# Patient Record
Sex: Female | Born: 1995 | Race: Black or African American | Hispanic: No | Marital: Single | Smoking: Never smoker
Health system: Southern US, Community
[De-identification: ages and names within clinical notes are randomized; demographics above are authoritative.]

## PROBLEM LIST (undated history)

## (undated) DIAGNOSIS — E119 Type 2 diabetes mellitus without complications: Secondary | ICD-10-CM

---

## 2017-11-25 ENCOUNTER — Emergency Department (HOSPITAL_COMMUNITY)
Admission: EM | Admit: 2017-11-25 | Discharge: 2017-11-25 | Disposition: A | Discharge status: Home / Self Care | Payer: No Typology Code available for payment source | Attending: Emergency Medicine | Admitting: Emergency Medicine

## 2017-11-25 ENCOUNTER — Other Ambulatory Visit: Payer: Self-pay

## 2017-11-25 ENCOUNTER — Encounter (HOSPITAL_COMMUNITY): Payer: Self-pay | Admitting: Nurse Practitioner

## 2017-11-25 ENCOUNTER — Emergency Department (HOSPITAL_COMMUNITY): Payer: No Typology Code available for payment source

## 2017-11-25 DIAGNOSIS — Y999 Unspecified external cause status: Secondary | ICD-10-CM | POA: Insufficient documentation

## 2017-11-25 DIAGNOSIS — Z041 Encounter for examination and observation following transport accident: Secondary | ICD-10-CM | POA: Diagnosis present

## 2017-11-25 DIAGNOSIS — Y939 Activity, unspecified: Secondary | ICD-10-CM | POA: Diagnosis not present

## 2017-11-25 DIAGNOSIS — R51 Headache: Secondary | ICD-10-CM | POA: Insufficient documentation

## 2017-11-25 DIAGNOSIS — Y929 Unspecified place or not applicable: Secondary | ICD-10-CM | POA: Insufficient documentation

## 2017-11-25 DIAGNOSIS — M542 Cervicalgia: Secondary | ICD-10-CM | POA: Diagnosis not present

## 2017-11-25 DIAGNOSIS — E119 Type 2 diabetes mellitus without complications: Secondary | ICD-10-CM | POA: Diagnosis not present

## 2017-11-25 HISTORY — DX: Type 2 diabetes mellitus without complications: E11.9

## 2017-11-25 MED ORDER — CYCLOBENZAPRINE HCL 10 MG PO TABS
5.0000 mg | ORAL_TABLET | Freq: Once | ORAL | Status: AC
  Administered 2017-11-25: 5 mg via ORAL
  Filled 2017-11-25: qty 1

## 2017-11-25 MED ORDER — IBUPROFEN 800 MG PO TABS
800.0000 mg | ORAL_TABLET | Freq: Once | ORAL | Status: AC
  Administered 2017-11-25: 800 mg via ORAL
  Filled 2017-11-25: qty 1

## 2017-11-25 MED ORDER — IBUPROFEN 800 MG PO TABS: 800.0000 mg | ORAL_TABLET | Freq: Three times a day (TID) | ORAL | 0 refills | Status: AC

## 2017-11-25 MED ORDER — CYCLOBENZAPRINE HCL 10 MG PO TABS: 10.0000 mg | ORAL_TABLET | Freq: Two times a day (BID) | ORAL | 0 refills | Status: AC | PRN
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
## 2017-11-25 NOTE — Discharge Instructions (Addendum)
X-ray of your neck showed that you do not have any fractures or any areas of slippage or dislocation. This is good news!  You may use Tylenol and/or Ibuprofen/Naproxen for pain relief and swelling. You may also use warm or cold compresses for additional relief. I have also prescribed you a muscle relaxer to help if you begin to have muscle spasms in your neck.  I am sorry your weekend started this way! I hope it gets better from here.
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
